# Patient Record
Sex: Male | Born: 1945
Health system: Southern US, Community
[De-identification: ages and names within clinical notes are randomized; demographics above are authoritative.]

## PROBLEM LIST (undated history)

## (undated) DIAGNOSIS — R079 Chest pain, unspecified: Secondary | ICD-10-CM

## (undated) DIAGNOSIS — J45909 Unspecified asthma, uncomplicated: Secondary | ICD-10-CM

## (undated) DIAGNOSIS — E119 Type 2 diabetes mellitus without complications: Secondary | ICD-10-CM

---

## 2019-05-08 ENCOUNTER — Ambulatory Visit: Payer: BLUE CROSS/BLUE SHIELD | Attending: Internal Medicine

## 2019-05-08 DIAGNOSIS — Z23 Encounter for immunization: Secondary | ICD-10-CM

## 2019-05-08 NOTE — Progress Notes (Signed)
   Covid-19 Vaccination Clinic  Name:  Matthew Holloway    MRN: [id number] DOB: [birth year]  05/08/2019  Matthew Holloway was observed post Covid-19 immunization for 15 minutes without incidence. He was provided with Vaccine Information Sheet and instruction to access the V-Safe system.   Matthew Holloway was instructed to call 911 with any severe reactions post vaccine: Marland Kitchen Difficulty breathing  . Swelling of your face and throat  . A fast heartbeat  . A bad rash all over your body  . Dizziness and weakness    Immunizations Administered    Name Date Dose VIS Date Route   Pfizer COVID-19 Vaccine 05/08/2019  1:29 PM 0.3 mL 02/26/2019 Intramuscular   Manufacturer: [employer], [employer]   Lot: [id number]   NDC: [address]-8

## 2019-05-31 ENCOUNTER — Ambulatory Visit: Payer: BLUE CROSS/BLUE SHIELD | Attending: Internal Medicine

## 2019-05-31 DIAGNOSIS — Z23 Encounter for immunization: Secondary | ICD-10-CM

## 2019-05-31 NOTE — Progress Notes (Signed)
   Covid-19 Vaccination Clinic  Name:  Yeshua Stryker    MRN: [id number] DOB: 1945/09/23  05/31/2019  Mr. Pallone was observed post Covid-19 immunization for 15 minutes without incident. He was provided with Vaccine Information Sheet and instruction to access the V-Safe system.   Mr. Morten was instructed to call 911 with any severe reactions post vaccine: Marland Kitchen Difficulty breathing  . Swelling of face and throat  . A fast heartbeat  . A bad rash all over body  . Dizziness and weakness   Immunizations Administered    Name Date Dose VIS Date Route   Pfizer COVID-19 Vaccine 05/31/2019  9:35 AM 0.3 mL 02/26/2019 Intramuscular   Manufacturer: [employer], [employer]   Lot: [id number]   NDC: [address]-7

## 2019-09-28 ENCOUNTER — Other Ambulatory Visit: Payer: Self-pay | Admitting: Urology

## 2019-09-28 DIAGNOSIS — N402 Nodular prostate without lower urinary tract symptoms: Secondary | ICD-10-CM

## 2019-10-25 ENCOUNTER — Other Ambulatory Visit: Payer: Self-pay | Admitting: Urology

## 2019-10-26 ENCOUNTER — Ambulatory Visit
Admission: RE | Admit: 2019-10-26 | Discharge: 2019-10-26 | Disposition: A | Discharge status: Home / Self Care | Payer: Medicare Other | Source: Ambulatory Visit | Attending: Urology | Admitting: Urology

## 2019-10-26 ENCOUNTER — Other Ambulatory Visit: Payer: Self-pay

## 2019-10-26 DIAGNOSIS — N402 Nodular prostate without lower urinary tract symptoms: Secondary | ICD-10-CM

## 2019-10-26 MED ORDER — GADOBENATE DIMEGLUMINE 529 MG/ML IV SOLN
17.0000 mL | Freq: Once | INTRAVENOUS | Status: AC | PRN
  Administered 2019-10-26: 17 mL via INTRAVENOUS

## 2020-10-05 ENCOUNTER — Other Ambulatory Visit: Payer: Self-pay | Admitting: Urology

## 2020-10-05 DIAGNOSIS — R972 Elevated prostate specific antigen [PSA]: Secondary | ICD-10-CM

## 2020-11-08 ENCOUNTER — Other Ambulatory Visit: Payer: Medicare Other

## 2020-11-10 ENCOUNTER — Ambulatory Visit
Admission: RE | Admit: 2020-11-10 | Discharge: 2020-11-10 | Disposition: A | Discharge status: Home / Self Care | Payer: Medicare Other | Source: Ambulatory Visit | Attending: Urology | Admitting: Urology

## 2020-11-10 ENCOUNTER — Other Ambulatory Visit: Payer: Self-pay

## 2020-11-10 DIAGNOSIS — R972 Elevated prostate specific antigen [PSA]: Secondary | ICD-10-CM

## 2020-11-10 MED ORDER — GADOBENATE DIMEGLUMINE 529 MG/ML IV SOLN
15.0000 mL | Freq: Once | INTRAVENOUS | Status: AC | PRN
  Administered 2020-11-10: 15 mL via INTRAVENOUS

## 2020-12-05 ENCOUNTER — Emergency Department (HOSPITAL_COMMUNITY)
Admission: EM | Admit: 2020-12-05 | Discharge: 2020-12-06 | Disposition: A | Discharge status: Home / Self Care | Payer: Medicare Other | Attending: Emergency Medicine | Admitting: Emergency Medicine

## 2020-12-05 ENCOUNTER — Other Ambulatory Visit: Payer: Self-pay

## 2020-12-05 ENCOUNTER — Emergency Department (HOSPITAL_COMMUNITY): Payer: Medicare Other

## 2020-12-05 ENCOUNTER — Encounter (HOSPITAL_COMMUNITY): Payer: Self-pay | Admitting: Emergency Medicine

## 2020-12-05 DIAGNOSIS — R072 Precordial pain: Secondary | ICD-10-CM

## 2020-12-05 DIAGNOSIS — E119 Type 2 diabetes mellitus without complications: Secondary | ICD-10-CM | POA: Insufficient documentation

## 2020-12-05 DIAGNOSIS — J45909 Unspecified asthma, uncomplicated: Secondary | ICD-10-CM | POA: Insufficient documentation

## 2020-12-05 DIAGNOSIS — R079 Chest pain, unspecified: Secondary | ICD-10-CM | POA: Insufficient documentation

## 2020-12-05 DIAGNOSIS — I10 Essential (primary) hypertension: Secondary | ICD-10-CM

## 2020-12-05 HISTORY — DX: Type 2 diabetes mellitus without complications: E11.9

## 2020-12-05 HISTORY — DX: Chest pain, unspecified: R07.9

## 2020-12-05 HISTORY — DX: Unspecified asthma, uncomplicated: J45.909

## 2020-12-05 LAB — BASIC METABOLIC PANEL
Anion gap: 6 (ref 5–15)
BUN: 10 mg/dL (ref 8–23)
CO2: 26 mmol/L (ref 22–32)
Calcium: 8.6 mg/dL — ABNORMAL LOW (ref 8.9–10.3)
Chloride: 102 mmol/L (ref 98–111)
Creatinine, Ser: 0.97 mg/dL (ref 0.61–1.24)
GFR, Estimated: >60 mL/min (ref >60)
Glucose, Bld: 162 mg/dL — ABNORMAL HIGH (ref 70–99)
Potassium: 4.3 mmol/L (ref 3.5–5.1)
Sodium: 134 mmol/L — ABNORMAL LOW (ref 135–145)

## 2020-12-05 LAB — CBC
HCT: 44.9 % (ref 39.0–52.0)
Hemoglobin: 14.8 g/dL (ref 13.0–17.0)
MCH: 30.5 pg (ref 26.0–34.0)
MCHC: 33 g/dL (ref 30.0–36.0)
MCV: 92.4 fL (ref 80.0–100.0)
Platelets: 262 10*3/uL (ref 150–400)
RBC: 4.86 MIL/uL (ref 4.22–5.81)
RDW: 13.2 % (ref 11.5–15.5)
WBC: 4.8 10*3/uL (ref 4.0–10.5)
nRBC: 0 % (ref 0.0–0.2)

## 2020-12-05 LAB — TROPONIN I (HIGH SENSITIVITY)
Troponin I (High Sensitivity): 5 ng/L (ref <18)
Troponin I (High Sensitivity): 6 ng/L (ref <18)

## 2020-12-05 NOTE — ED Triage Notes (Signed)
Pt states he has had chest pain and lightheadedness since yesterday. States he had his Covid booster 13 days ago and is concerned it has caused 'heart inflammation.' Alert and oriented.

## 2020-12-05 NOTE — ED Provider Notes (Signed)
Emergency Medicine Provider Triage Evaluation Note  Matthew Holloway , a 75 y.o. male  was evaluated in triage.  Pt complains of 4-day history of intermittent sharp left-sided chest pain.  Patient states that he had the COVID booster and influenza vaccine 10 days prior to the pain.  Thought it to be related.  He states he exercises on his treadmill for 30 minutes 3-4 times a week and has exercised after the onset of his chest pain and did not have any increase in symptoms.  He denies fever, chills, cough, congestion, leg pain, leg swelling.  He does mention associated lightheadedness but denies any syncope.  He states that he stopped taking his blood pressure pressure medication 7 months ago.    Review of Systems  Positive:  Negative: See above  Physical Exam  BP (!) 174/77 (BP Location: Left Arm)   Pulse 78   Temp 98.5 F (36.9 C) (Oral)   Resp 11   Ht 6\' 2"  (1.88 m)   Wt 80.3 kg   SpO2 98%   BMI 22.73 kg/m  Gen:   Awake, no distress   Resp:  Normal effort, clear to auscultation bilaterally MSK:   Moves extremities without difficulty  Other:  Heart rate is regular and normal.  Medical Decision Making  Medically screening exam initiated at 3:34 PM.  Appropriate orders placed.  Markcus Lazenby was informed that the remainder of the evaluation will be completed by another provider, this initial triage assessment does not replace that evaluation, and the importance of remaining in the ED until their evaluation is complete.  I am suspicious for costochondritis.  Chest pain is reproducible on exam.  Labs are ordered to rule out possible ACS.  Patient's poorly controlled hypertension is likely contributing to his lightheadedness.   Gustavo Lah [place], PA-C 12/05/20 1536    12/07/20, MD 12/06/20 1155

## 2020-12-06 ENCOUNTER — Encounter (HOSPITAL_COMMUNITY): Payer: Self-pay | Admitting: Emergency Medicine

## 2020-12-06 DIAGNOSIS — R079 Chest pain, unspecified: Secondary | ICD-10-CM | POA: Diagnosis not present

## 2020-12-06 MED ORDER — AMLODIPINE BESYLATE 5 MG PO TABS: 10.0000 mg | ORAL_TABLET | Freq: Once | ORAL | Status: DC

## 2020-12-06 MED ORDER — ALUM & MAG HYDROXIDE-SIMETH 200-200-20 MG/5ML PO SUSP
30.0000 mL | Freq: Once | ORAL | Status: AC
  Administered 2020-12-06: 30 mL via ORAL
  Filled 2020-12-06: qty 30

## 2020-12-06 MED ORDER — ACETAMINOPHEN 500 MG PO TABS
1000.0000 mg | ORAL_TABLET | Freq: Once | ORAL | Status: AC
  Administered 2020-12-06: 1000 mg via ORAL
  Filled 2020-12-06: qty 2

## 2020-12-06 NOTE — ED Provider Notes (Signed)
[facility]-EMERGENCY DEPT Provider Note   CSN: [id number] Arrival date & time: 12/05/20  1439     History Chief Complaint  Patient presents with   Chest Pain    Matthew Holloway is a 75 y.o. male.  The history is provided by the patient.  Chest Pain Pain location:  L chest Pain quality: aching   Pain radiates to:  Does not radiate Pain severity:  Moderate Onset quality:  Gradual Duration:  4 days Timing:  Constant Progression:  Unchanged Chronicity:  New Context: movement   Relieved by:  Nothing Worsened by:  Nothing Ineffective treatments:  None tried Associated symptoms: no abdominal pain, no cough, no diaphoresis, no nausea, no orthopnea, no palpitations, no PND, no shortness of breath and no weakness   Risk factors: male sex   Risk factors: no aortic disease   Thinks his covid vaccine     Past Medical History:  Diagnosis Date   Asthma    Chest pain    Diabetes mellitus without complication (HCC)     There are no problems to display for this patient.   History reviewed. No pertinent surgical history.     History reviewed. No pertinent family history.     Home Medications Prior to Admission medications   Not on File    Allergies    Lisinopril and Shellfish allergy  Review of Systems   Review of Systems  Constitutional:  Negative for diaphoresis.  HENT:  Negative for facial swelling.   Eyes:  Negative for redness.  Respiratory:  Negative for cough and shortness of breath.   Cardiovascular:  Positive for chest pain. Negative for palpitations, orthopnea and PND.  Gastrointestinal:  Negative for abdominal pain and nausea.  Genitourinary:  Negative for difficulty urinating.  Musculoskeletal:  Negative for neck stiffness.  Skin:  Negative for rash.  Neurological:  Negative for facial asymmetry and weakness.  All other systems reviewed and are negative.  Physical Exam Updated Vital Signs BP (!) 180/90   Pulse 82   Temp 97.8  F (36.6 C) (Oral)   Resp 18   Ht 6\' 2"  (1.88 m)   Wt 80.3 kg   SpO2 100%   BMI 22.73 kg/m   Physical Exam Vitals and nursing note reviewed.  Constitutional:      General: He is not in acute distress.    Appearance: Normal appearance.  HENT:     Head: Normocephalic and atraumatic.     Nose: Nose normal.  Eyes:     Conjunctiva/sclera: Conjunctivae normal.     Pupils: Pupils are equal, round, and reactive to light.  Cardiovascular:     Rate and Rhythm: Normal rate and regular rhythm.     Pulses: Normal pulses.     Heart sounds: Normal heart sounds.  Pulmonary:     Effort: Pulmonary effort is normal.     Breath sounds: Normal breath sounds.  Chest:     Chest wall: Tenderness present.  Abdominal:     General: Abdomen is flat. Bowel sounds are normal.     Palpations: Abdomen is soft.     Tenderness: There is no abdominal tenderness. There is no guarding.  Musculoskeletal:        General: Normal range of motion.     Cervical back: Normal range of motion and neck supple.  Skin:    General: Skin is warm and dry.     Capillary Refill: Capillary refill takes less than 2 seconds.  Neurological:  General: No focal deficit present.     Mental Status: He is alert and oriented to person, place, and time.     Deep Tendon Reflexes: Reflexes normal.  Psychiatric:        Mood and Affect: Mood normal.        Behavior: Behavior normal.    ED Results / Procedures / Treatments   Labs (all labs ordered are listed, but only abnormal results are displayed) Labs Reviewed  BASIC METABOLIC PANEL - Abnormal; Notable for the following components:      Result Value   Sodium 134 (*)    Glucose, Bld 162 (*)    Calcium 8.6 (*)    All other components within normal limits  CBC  TROPONIN I (HIGH SENSITIVITY)  TROPONIN I (HIGH SENSITIVITY)    EKG None  Radiology DG Chest 2 View  Result Date: 12/05/2020 CLINICAL DATA:  Left-sided chest pain. EXAM: CHEST - 2 VIEW COMPARISON:  None.  FINDINGS: The heart size and mediastinal contours are within normal limits. Very mild calcification of the aortic arch is noted. Both lungs are clear. The visualized skeletal structures are unremarkable. IMPRESSION: No active cardiopulmonary disease. Electronically Signed   By: Aram Candela M.D.   On: 12/05/2020 16:12    Procedures Procedures   Medications Ordered in ED Medications  alum & mag hydroxide-simeth (MAALOX/MYLANTA) 200-200-20 MG/5ML suspension 30 mL (30 mLs Oral Given 12/06/20 0201)  acetaminophen (TYLENOL) tablet 1,000 mg (1,000 mg Oral Given 12/06/20 0200)    ED Course  I have reviewed the triage vital signs and the nursing notes.  Pertinent labs & imaging results that were available during my care of the patient were reviewed by me and considered in my medical decision making (see chart for details).   Not the covid vaccine.  Pain is reproducible.  Tylenol alternating with ibuprofen.    Matthew Holloway was evaluated in Emergency Department on 12/06/2020 for the symptoms described in the history of present illness. He was evaluated in the context of the global COVID-19 pandemic, which necessitated consideration that the patient might be at risk for infection with the SARS-CoV-2 virus that causes COVID-19. Institutional protocols and algorithms that pertain to the evaluation of patients at risk for COVID-19 are in a state of rapid change based on information released by regulatory bodies including the CDC and federal and state organizations. These policies and algorithms were followed during the patient's care in the ED.  Final Clinical Impression(s) / ED Diagnoses Final diagnoses:  None   Return for intractable cough, coughing up blood, fevers > 100.4 unrelieved by medication, shortness of breath, intractable vomiting, chest pain, shortness of breath, weakness, numbness, changes in speech, facial asymmetry, abdominal pain, passing out, Inability to tolerate liquids or food,  cough, altered mental status or any concerns. No signs of systemic illness or infection. The patient is nontoxic-appearing on exam and vital signs are within normal limits. I have reviewed the triage vital signs and the nursing notes. Pertinent labs & imaging results that were available during my care of the patient were reviewed by me and considered in my medical decision making (see chart for details). After history, exam, and medical workup I feel the patient has been appropriately medically screened and is safe for discharge home. Pertinent diagnoses were discussed with the patient. Patient was given return precautions. Rx / DC Orders ED Discharge Orders     None        Jodi Criscuolo, MD 12/06/20 [phone]

## 2022-11-01 ENCOUNTER — Other Ambulatory Visit: Payer: Self-pay | Admitting: Urology

## 2022-11-01 DIAGNOSIS — R972 Elevated prostate specific antigen [PSA]: Secondary | ICD-10-CM

## 2022-12-26 ENCOUNTER — Ambulatory Visit
Admission: RE | Admit: 2022-12-26 | Discharge: 2022-12-26 | Disposition: A | Discharge status: Home / Self Care | Payer: Medicare Other | Source: Ambulatory Visit | Attending: Urology | Admitting: Urology

## 2022-12-26 DIAGNOSIS — R972 Elevated prostate specific antigen [PSA]: Secondary | ICD-10-CM

## 2022-12-26 MED ORDER — GADOPICLENOL 0.5 MMOL/ML IV SOLN
8.0000 mL | Freq: Once | INTRAVENOUS | Status: AC | PRN
  Administered 2022-12-26: 8 mL via INTRAVENOUS

## 2023-07-12 IMAGING — CR DG CHEST 2V
2 series · 2 of 2 positions shown · non-contrast
Comparison: None.

CLINICAL DATA: Left-sided chest pain.

EXAM:
CHEST - 2 VIEW

[w chest pa]
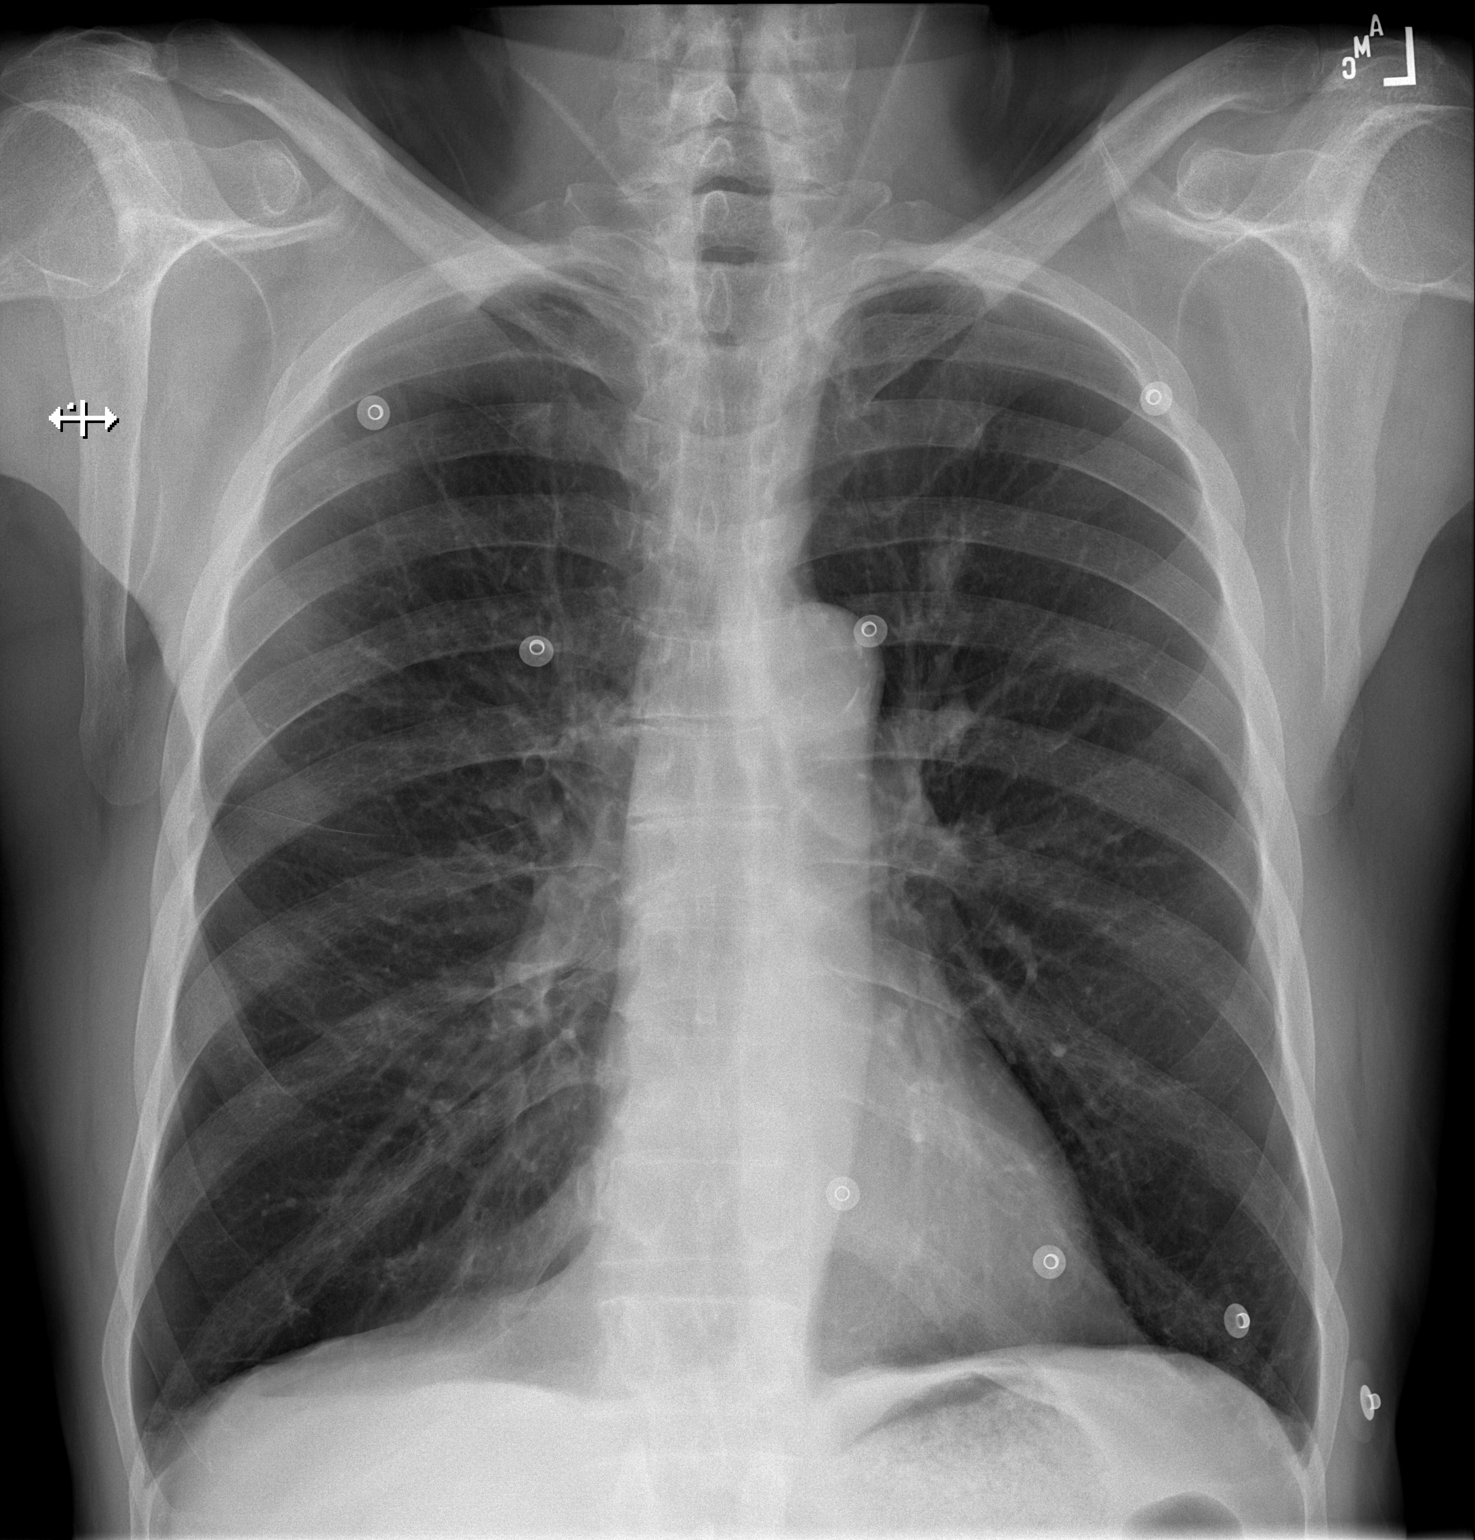

[w chest lat]
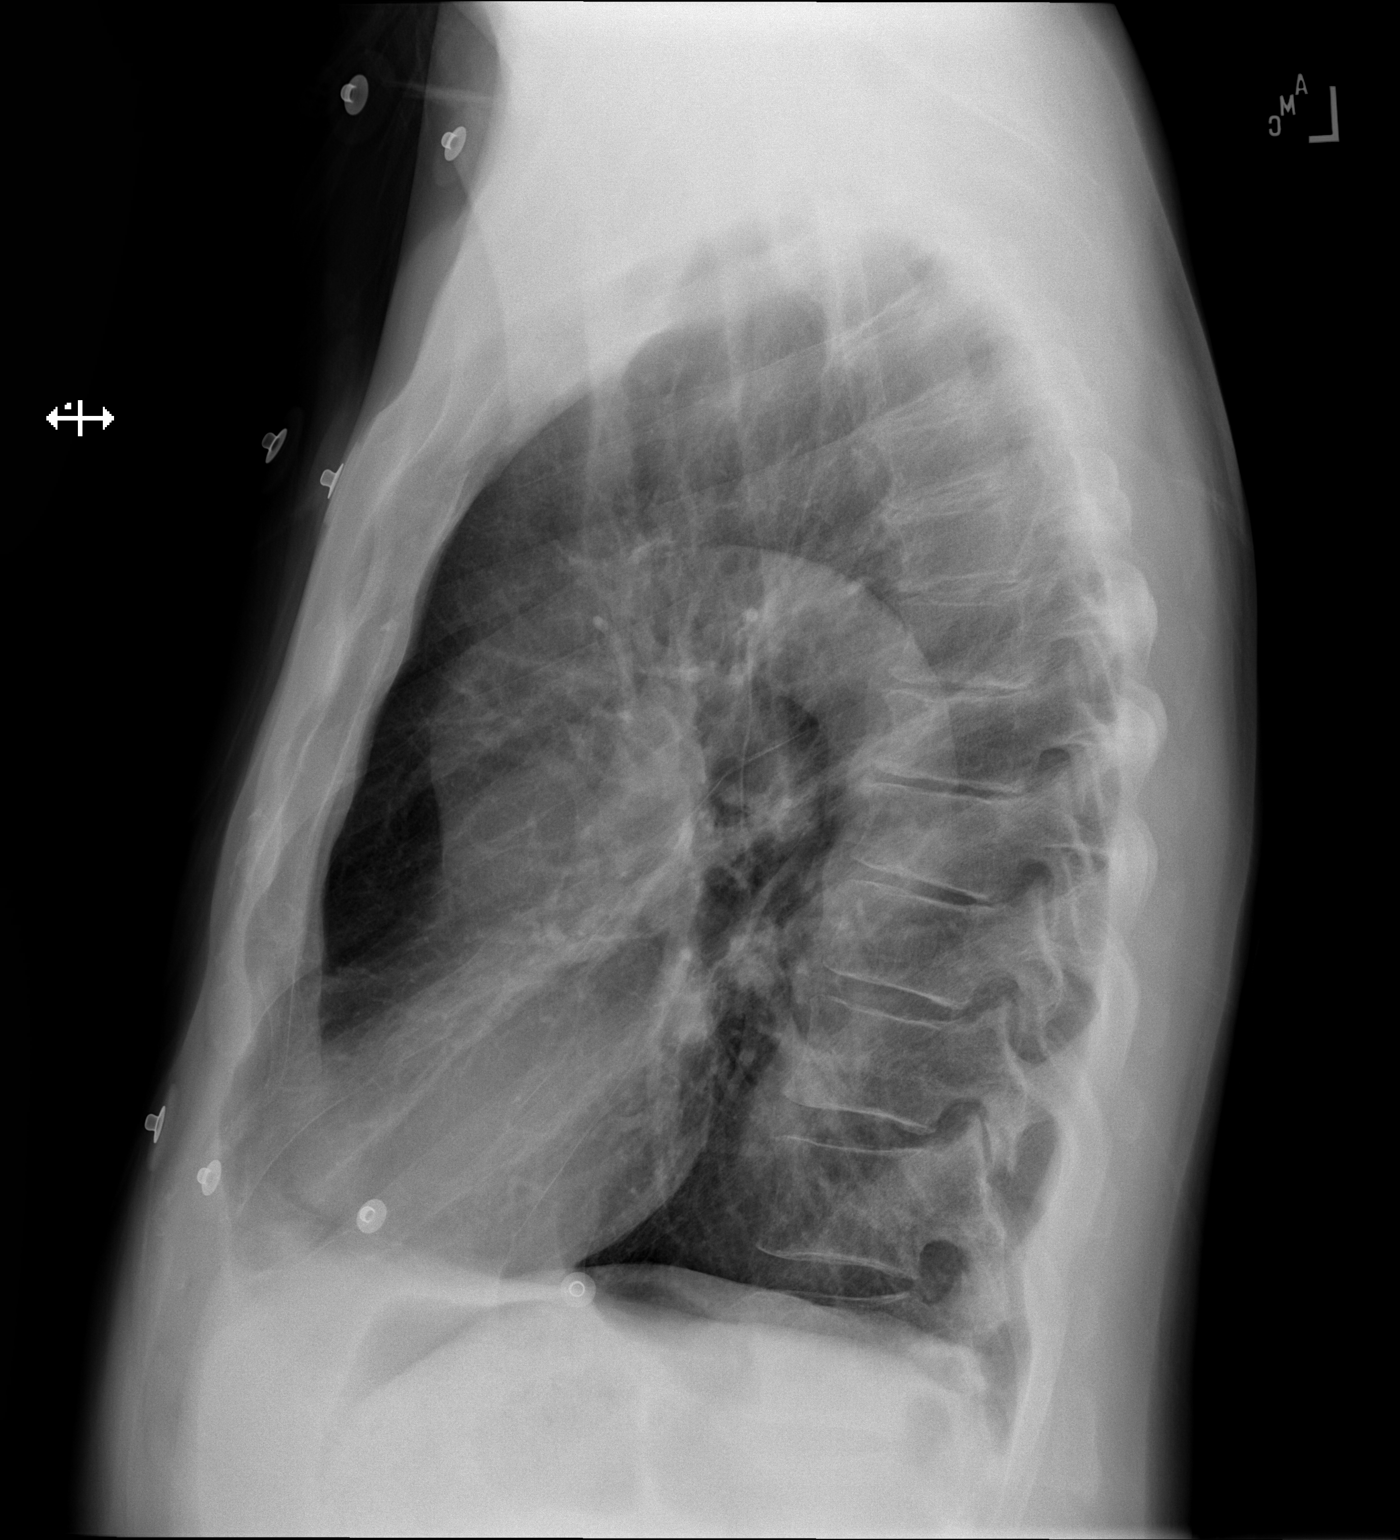

[2 of 2 positions shown; findings below may reference images not displayed]

FINDINGS: The heart size and mediastinal contours are within normal limits.
Very mild calcification of the aortic arch is noted. Both lungs are
clear. The visualized skeletal structures are unremarkable.
IMPRESSION: No active cardiopulmonary disease.
